# Patient Record
Sex: Male | Born: 1985 | Race: White | Hispanic: No | Marital: Married | Smoking: Never smoker
Health system: Southern US, Community
[De-identification: ages and names within clinical notes are randomized; demographics above are authoritative.]

## PROBLEM LIST (undated history)

## (undated) DIAGNOSIS — E109 Type 1 diabetes mellitus without complications: Secondary | ICD-10-CM

## (undated) HISTORY — PX: SHOULDER SURGERY: SHX246

## (undated) HISTORY — PX: VASECTOMY: SHX75

---

## 2017-09-25 ENCOUNTER — Ambulatory Visit (INDEPENDENT_AMBULATORY_CARE_PROVIDER_SITE_OTHER): Payer: 59

## 2017-09-25 ENCOUNTER — Other Ambulatory Visit: Payer: Self-pay

## 2017-09-25 ENCOUNTER — Ambulatory Visit
Admission: EM | Admit: 2017-09-25 | Discharge: 2017-09-25 | Disposition: A | Discharge status: Home / Self Care | Payer: 59 | Attending: Family Medicine | Admitting: Family Medicine

## 2017-09-25 DIAGNOSIS — M79671 Pain in right foot: Secondary | ICD-10-CM

## 2017-09-25 HISTORY — DX: Type 1 diabetes mellitus without complications: E10.9

## 2017-09-25 MED ORDER — SULFAMETHOXAZOLE-TRIMETHOPRIM 800-160 MG PO TABS: 1.0000 | ORAL_TABLET | Freq: Two times a day (BID) | ORAL | 0 refills | Status: AC

## 2017-09-25 MED ORDER — MELOXICAM 15 MG PO TABS: 15.0000 mg | ORAL_TABLET | Freq: Every day | ORAL | 0 refills | Status: AC | PRN

## 2017-09-25 NOTE — ED Triage Notes (Signed)
Patient complains of right foot pain that started last week. Patient has red swollen area along the side of his foot near pinky toe. Patient states that pain is worse with walking.

## 2017-09-25 NOTE — Discharge Instructions (Addendum)
Take medication as prescribed. Rest. Drink plenty of fluids. Change shoes.   Follow up with podiatry as needed for continued complaints.   Follow up with your primary care physician this week as needed. Return to Urgent care for new or worsening concerns.

## 2017-09-25 NOTE — ED Provider Notes (Signed)
[facility] ____________________________________________  Time seen: Approximately 3:12 PM  I have reviewed the triage vital signs and the nursing notes.   HISTORY  Chief Complaint Foot Pain (right)   HPI Elijah Fletcher is a 32 y.o. male presented for evaluation of right lateral foot pain present for the last several days up to 1 week.  States that the area on the side of his right foot seems to be somewhat red and swollen.  Denies trauma or injury,  but does report he frequently bumps into things at work.  Denies abrupt onset.  Reports gradual.  States intimately feels like there is some swelling in that area.  Denies history of same in the past.  Denies no break in skin.  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  Reports type I diabetic and states that his blood sugars have been well controlled, currently 104.  No alleviating measures attempted.  Denies other aggravating or alleviating factors.  Reports otherwise as well. Denies recent sickness. Denies recent antibiotic use.    Past Medical History:  Diagnosis Date  . Type 1 diabetes (HCC)     There are no active problems to display for this patient.   Past Surgical History:  Procedure Laterality Date  . SHOULDER SURGERY Right   . VASECTOMY       No current facility-administered medications for this encounter.   Current Outpatient Medications:  .  insulin aspart (NOVOLOG FLEXPEN) 100 UNIT/ML FlexPen, 1 unit per 20g of carb.  1 per 50 over 120.  Up to 50 units daily, Disp: , Rfl:  .  insulin lispro (HUMALOG KWIKPEN) 100 UNIT/ML KiwkPen, INJECT 1 UNIT PER 20 G CARB.1 PER 60 GLUCOSE ABOVE 120.UP TO 50 UNITS DAILY, Disp: , Rfl:  .  ZUBSOLV 5.7-1.4 MG SUBL, DISSOLVE 2-2.5 TS UNT QD, Disp: , Rfl: 0 .  meloxicam (MOBIC) 15 MG tablet, Take 1 tablet (15 mg total) by mouth daily as needed., Disp: 10 tablet, Rfl: 0 .  sulfamethoxazole-trimethoprim (BACTRIM DS,SEPTRA DS) 800-160 MG tablet,  Take 1 tablet by mouth 2 (two) times daily for 7 days., Disp: 14 tablet, Rfl: 0  Allergies Patient has no known allergies.  History reviewed. No pertinent family history.  Social History Social History   Tobacco Use  . Smoking status: Never Smoker  . Smokeless tobacco: Current User    Types: Chew  Substance Use Topics  . Alcohol use: Not Currently    Frequency: Never  . Drug use: Not Currently    Review of Systems Constitutional: No fever/chills Cardiovascular: Denies chest pain. Respiratory: Denies shortness of breath. Gastrointestinal: No abdominal pain.  Musculoskeletal: Negative for back pain. Skin: as above.   ____________________________________________   PHYSICAL EXAM:  VITAL SIGNS: ED Triage Vitals  Enc Vitals Group     BP 09/25/17 1421 120/69     Pulse Rate 09/25/17 1421 70     Resp 09/25/17 1421 17     Temp 09/25/17 1421 98.5 F (36.9 C)     Temp Source 09/25/17 1421 Oral     SpO2 09/25/17 1421 98 %     Weight 09/25/17 1419 175 lb (79.4 kg)     Height 09/25/17 1419 6' (1.829 m)     Head Circumference --      Peak Flow --      Pain Score 09/25/17 1419 3     Pain Loc --      Pain Edu? --  Excl. in GC? --     Constitutional: Alert and oriented. Well appearing and in no acute distress. ENT      Head: Normocephalic and atraumatic. Cardiovascular: Normal rate, regular rhythm. Grossly normal heart sounds.  Good peripheral circulation. Respiratory: Normal respiratory effort without tachypnea nor retractions. Breath sounds are clear and equal bilaterally. No wheezes, rales, rhonchi. Musculoskeletal: Steady gait. Bilateral pedal pulses equal and easily palpated. Except: Right lateral foot at the distal fifth metatarsal laterally as well as distal plantar aspect mild tenderness to direct palpation with mild erythema at bony prominence of distal fifth metatarsal, otherwise right foot nontender, normal distal sensation and capillary refill.  Neurologic:   Normal speech and language.  Speech is normal. No gait instability.  Skin:  Skin is warm, dry. Psychiatric: Mood and affect are normal. Speech and behavior are normal. Patient exhibits appropriate insight and judgment   ___________________________________________   LABS (all labs ordered are listed, but only abnormal results are displayed)  Labs Reviewed - No data to display  RADIOLOGY  Dg Foot Complete Right  Result Date: 09/25/2017 CLINICAL DATA:  Right foot pain and swelling. EXAM: RIGHT FOOT COMPLETE - 3+ VIEW COMPARISON:  None. FINDINGS: There is no evidence of fracture or dislocation. There is no evidence of arthropathy or other focal bone abnormality. Soft tissues are unremarkable. IMPRESSION: Negative. Electronically Signed   By: Kennith Center M.D.   On: 09/25/2017 15:13   ____________________________________________   PROCEDURES Procedures   INITIAL IMPRESSION / ASSESSMENT AND PLAN / ED COURSE  Pertinent labs & imaging results that were available during my care of the patient were reviewed by me and considered in my medical decision making (see chart for details).  We will bring patient.  No acute distress.  Right foot x-ray as above negative per radiologist and reviewed by myself.  Suspect local inflammatory response, infectious versus friction.  Counseled regarding monitoring shoes and changing boots as concerned this may be a irritant factor.  As patient type I diabetic with localized erythema, will empirically treat with oral Bactrim as concern for secondary infection.  Also Rx daily Mobic.  Encourage keeping clean, supportive care and close monitoring.Discussed indication, risks and benefits of medications with patient.  Work note given for today and tomorrow.   Discussed follow up with Primary care physician this week. Discussed follow up and return parameters including no resolution or any worsening concerns. Patient verbalized understanding and agreed to plan.    ____________________________________________   FINAL CLINICAL IMPRESSION(S) / ED DIAGNOSES  Final diagnoses:  Foot pain, right     ED Discharge Orders        Ordered    sulfamethoxazole-trimethoprim (BACTRIM DS,SEPTRA DS) 800-160 MG tablet  2 times daily     09/25/17 1522    meloxicam (MOBIC) 15 MG tablet  Daily PRN     09/25/17 1522       Note: This dictation was prepared with Dragon dictation along with smaller phrase technology. Any transcriptional errors that result from this process are unintentional.         Renford Dills, NP 09/25/17 [phone]

## 2018-09-08 ENCOUNTER — Other Ambulatory Visit: Payer: Self-pay | Admitting: Gastroenterology

## 2018-09-08 DIAGNOSIS — B182 Chronic viral hepatitis C: Secondary | ICD-10-CM

## 2018-10-06 ENCOUNTER — Other Ambulatory Visit: Payer: Self-pay

## 2018-10-06 ENCOUNTER — Encounter (INDEPENDENT_AMBULATORY_CARE_PROVIDER_SITE_OTHER): Payer: Self-pay

## 2018-10-06 ENCOUNTER — Ambulatory Visit
Admission: RE | Admit: 2018-10-06 | Discharge: 2018-10-06 | Disposition: A | Discharge status: Home / Self Care | Payer: BLUE CROSS/BLUE SHIELD | Source: Ambulatory Visit | Attending: Gastroenterology | Admitting: Gastroenterology

## 2018-10-06 DIAGNOSIS — B182 Chronic viral hepatitis C: Secondary | ICD-10-CM | POA: Diagnosis present

## 2019-06-07 IMAGING — US ULTRASOUND ABDOMEN COMPLETE
1 series · 14 of 25 positions shown · non-contrast
Comparison: None.

CLINICAL DATA: Chronic hepatitis

EXAM:
ABDOMEN ULTRASOUND COMPLETE

[Series 1: ultrasound abdomen complete · 0.22mm/px · 14 of 328 slices shown]
[im 1/328]
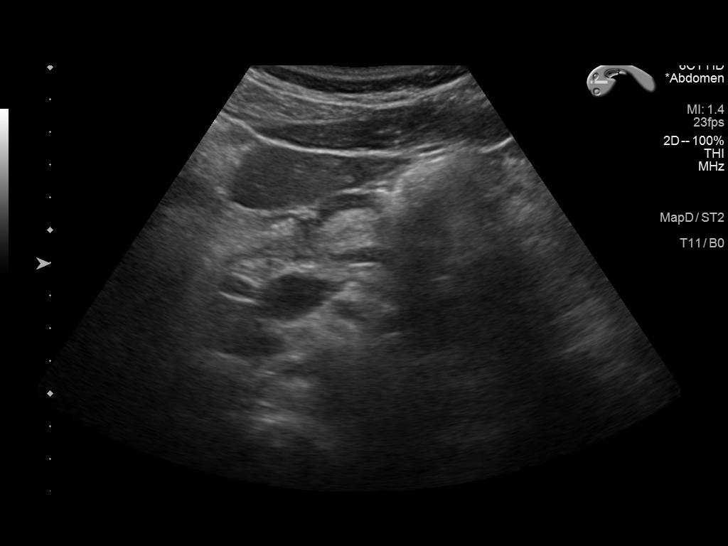
[im 28/328]
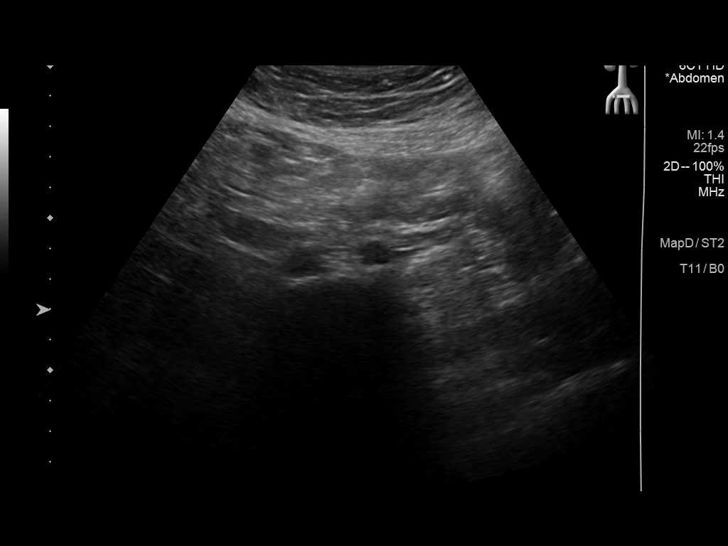
[im 55/328]
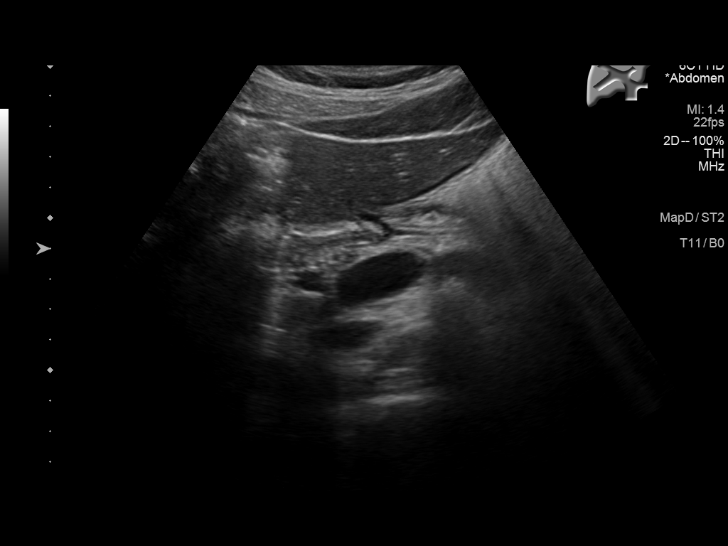
[im 82/328]
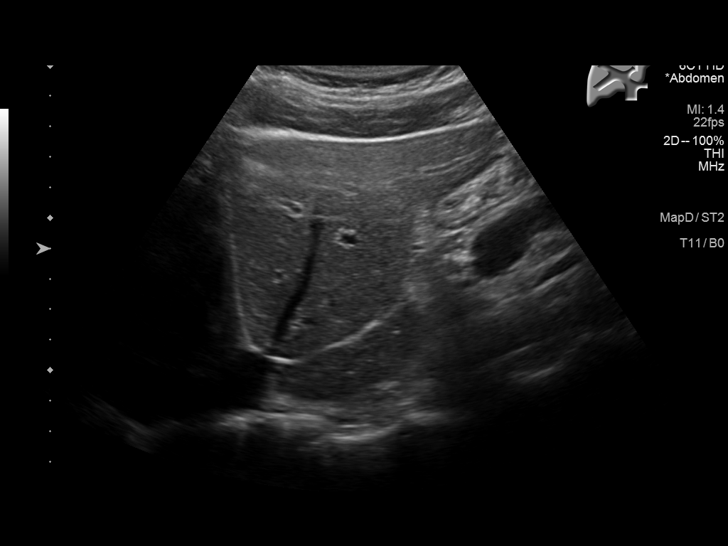
[im 110/328]
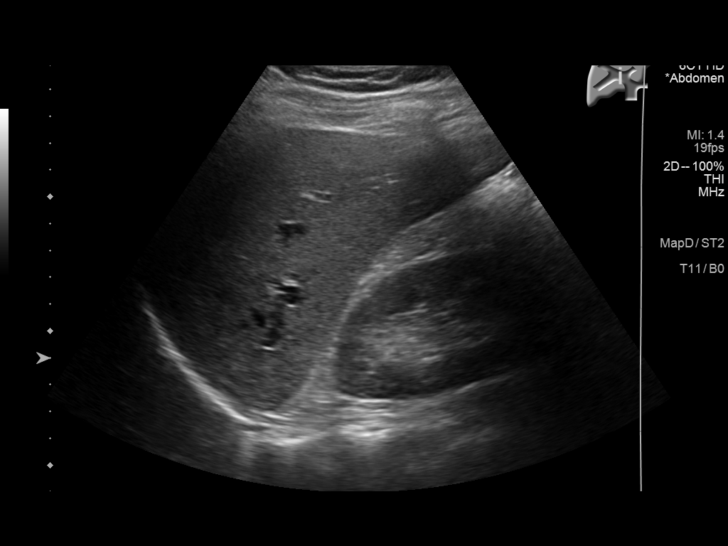
[im 123/328]
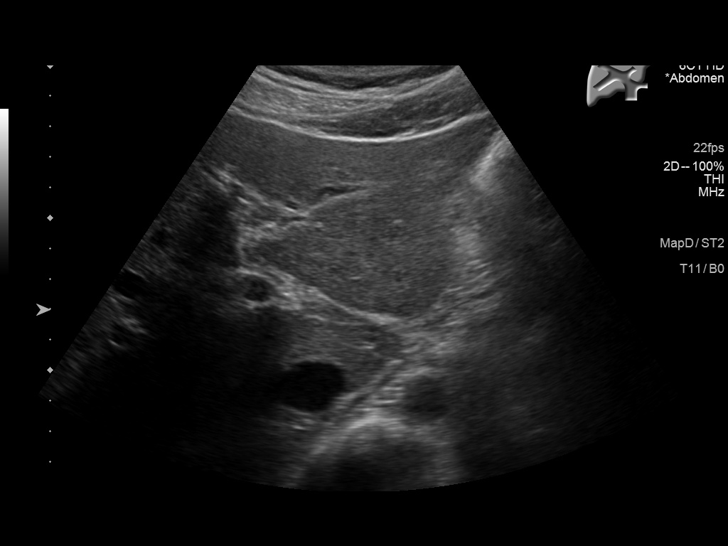
[im 150/328]
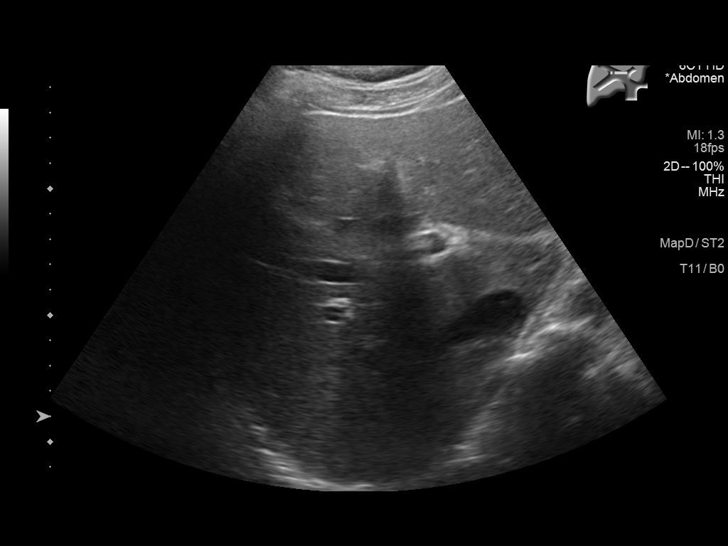
[im 178/328]
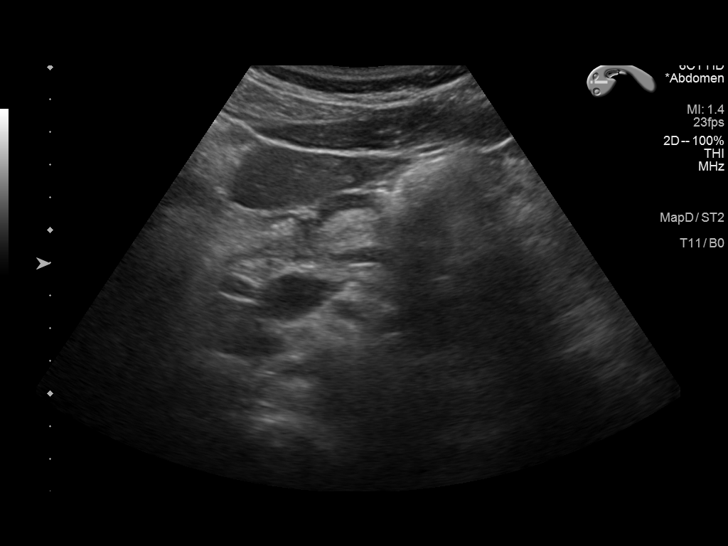
[im 205/328]
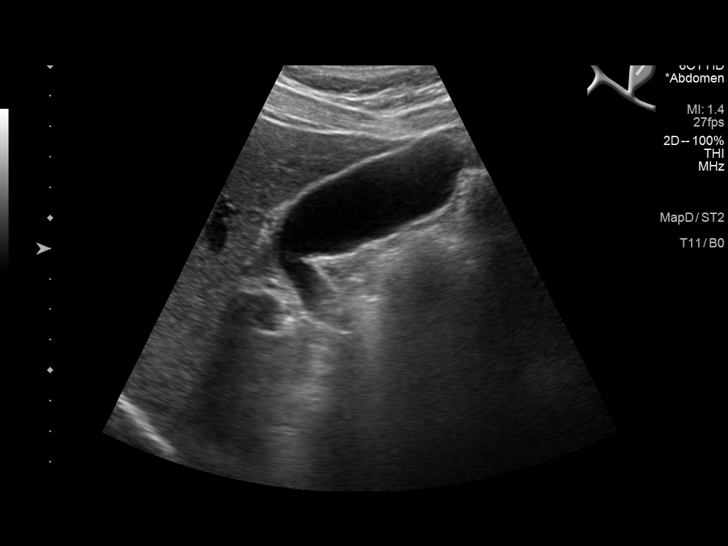
[im 219/328]
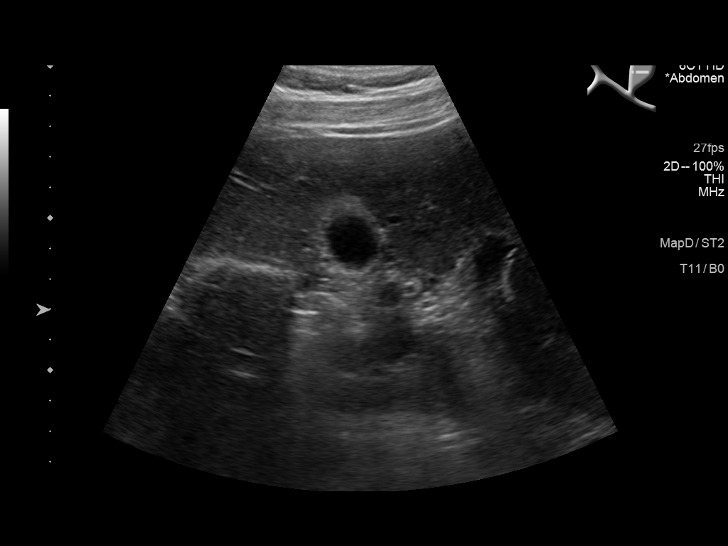
[im 246/328]
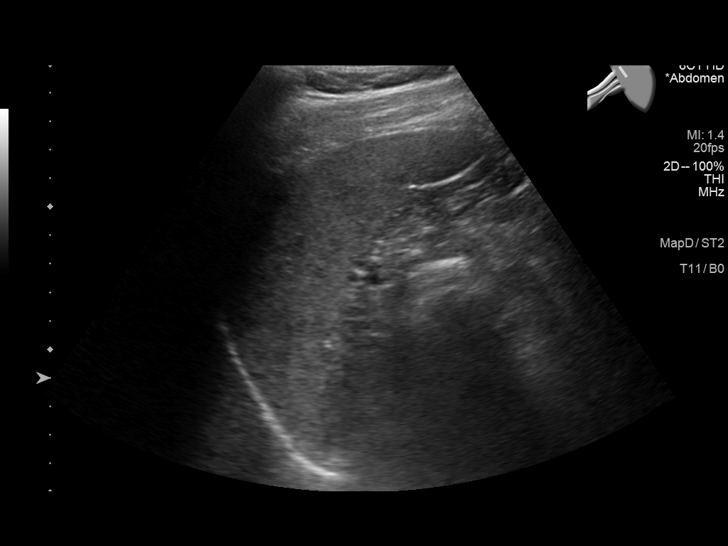
[im 273/328]
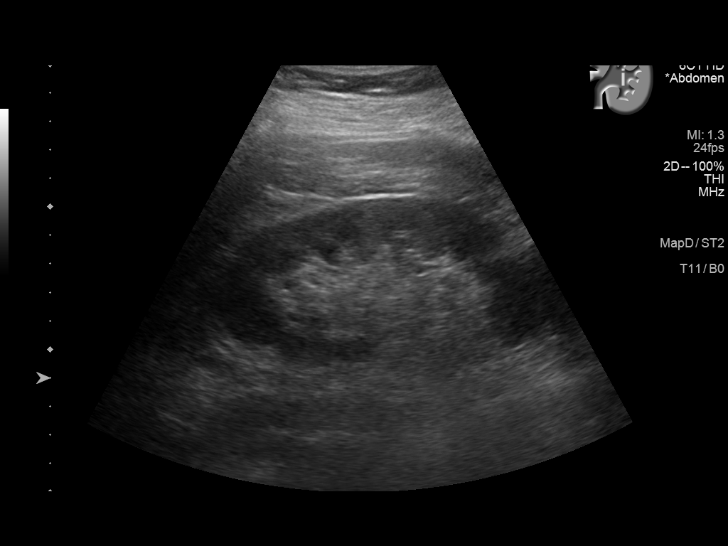
[im 300/328]
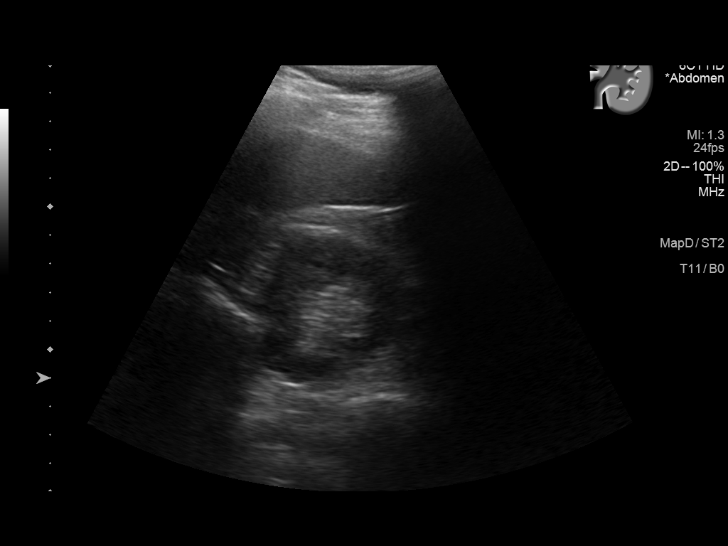
[im 328/328]
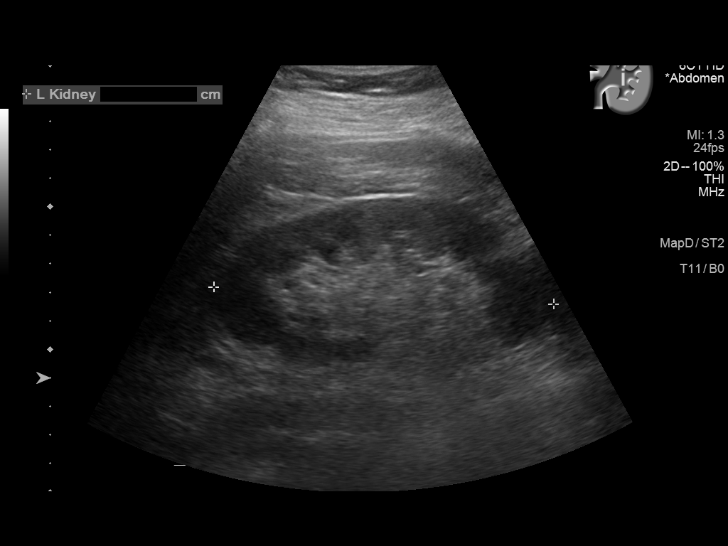

[14 of 25 positions shown; findings below may reference images not displayed]

FINDINGS: Gallbladder: No gallstones or wall thickening visualized. There is
no pericholecystic fluid. No sonographic Murphy sign noted by
sonographer.

Common bile duct: Diameter: 7 mm, upper normal. No intrahepatic
biliary duct dilatation. No biliary duct mass or calculus is
demonstrable.

Liver: No focal lesion identified. Within normal limits in
parenchymal echogenicity. Portal vein is patent on color Doppler
imaging with normal direction of blood flow towards the liver.

IVC: No abnormality visualized.

Pancreas: Visualized portion unremarkable. Portions of pancreas
obscured by gas.

Spleen: Size and appearance within normal limits.

Right Kidney: Length: 11.1 cm. Echogenicity within normal limits. No
mass or hydronephrosis visualized.

Left Kidney: Length: 11.9 cm. Echogenicity within normal limits. No
mass or hydronephrosis visualized.

Abdominal aorta: No aneurysm visualized.

Other findings: No demonstrable ascites.
IMPRESSION: Portions of pancreas obscured by gas. Visualized portions of
pancreas appear unremarkable.

Study otherwise unremarkable.
